# Patient Record
Sex: Female | Born: 1975 | Race: Black or African American | Hispanic: No | Marital: Single | Smoking: Never smoker
Health system: Southern US, Community
[De-identification: ages and names within clinical notes are randomized; demographics above are authoritative.]

## PROBLEM LIST (undated history)

## (undated) HISTORY — PX: DILATION AND CURETTAGE OF UTERUS: SHX78

---

## 1999-01-15 ENCOUNTER — Emergency Department (HOSPITAL_COMMUNITY): Admission: EM | Admit: 1999-01-15 | Discharge: 1999-01-15 | Payer: Self-pay | Admitting: *Deleted

## 2000-01-31 ENCOUNTER — Emergency Department (HOSPITAL_COMMUNITY): Admission: EM | Admit: 2000-01-31 | Discharge: 2000-01-31 | Payer: Self-pay | Admitting: *Deleted

## 2004-02-21 ENCOUNTER — Emergency Department (HOSPITAL_COMMUNITY): Admission: EM | Admit: 2004-02-21 | Discharge: 2004-02-21 | Payer: Self-pay | Admitting: Emergency Medicine

## 2013-07-12 ENCOUNTER — Encounter (HOSPITAL_COMMUNITY): Payer: Self-pay | Admitting: *Deleted

## 2013-07-12 ENCOUNTER — Inpatient Hospital Stay (HOSPITAL_COMMUNITY)
Admission: AD | Admit: 2013-07-12 | Discharge: 2013-07-13 | Disposition: A | Discharge status: Home / Self Care | Payer: BC Managed Care – PPO | Source: Ambulatory Visit | Attending: Obstetrics & Gynecology | Admitting: Obstetrics & Gynecology

## 2013-07-12 ENCOUNTER — Inpatient Hospital Stay (HOSPITAL_COMMUNITY): Payer: BC Managed Care – PPO

## 2013-07-12 DIAGNOSIS — N739 Female pelvic inflammatory disease, unspecified: Secondary | ICD-10-CM | POA: Insufficient documentation

## 2013-07-12 DIAGNOSIS — M549 Dorsalgia, unspecified: Secondary | ICD-10-CM | POA: Insufficient documentation

## 2013-07-12 DIAGNOSIS — N390 Urinary tract infection, site not specified: Secondary | ICD-10-CM | POA: Insufficient documentation

## 2013-07-12 DIAGNOSIS — R109 Unspecified abdominal pain: Secondary | ICD-10-CM | POA: Insufficient documentation

## 2013-07-12 DIAGNOSIS — N730 Acute parametritis and pelvic cellulitis: Secondary | ICD-10-CM

## 2013-07-12 DIAGNOSIS — Z30432 Encounter for removal of intrauterine contraceptive device: Secondary | ICD-10-CM | POA: Insufficient documentation

## 2013-07-12 DIAGNOSIS — N3000 Acute cystitis without hematuria: Secondary | ICD-10-CM

## 2013-07-12 LAB — WET PREP, GENITAL
TRICH WET PREP: NONE SEEN
Yeast Wet Prep HPF POC: NONE SEEN

## 2013-07-12 LAB — POCT PREGNANCY, URINE: Preg Test, Ur: NEGATIVE

## 2013-07-12 LAB — URINALYSIS, ROUTINE W REFLEX MICROSCOPIC
Bilirubin Urine: NEGATIVE
Glucose, UA: NEGATIVE mg/dL
Ketones, ur: NEGATIVE mg/dL
Nitrite: NEGATIVE
Protein, ur: 100 mg/dL — AB
Specific Gravity, Urine: 1.01 (ref 1.005–1.030)
Urobilinogen, UA: 2 mg/dL — ABNORMAL HIGH (ref 0.0–1.0)
pH: 8.5 — ABNORMAL HIGH (ref 5.0–8.0)

## 2013-07-12 LAB — URINE MICROSCOPIC-ADD ON

## 2013-07-12 MED ORDER — AZITHROMYCIN 250 MG PO TABS
1000.0000 mg | ORAL_TABLET | ORAL | Status: AC
  Administered 2013-07-12: 1000 mg via ORAL
  Filled 2013-07-12: qty 4

## 2013-07-12 MED ORDER — ONDANSETRON 8 MG PO TBDP
8.0000 mg | ORAL_TABLET | Freq: Once | ORAL | Status: AC
  Administered 2013-07-12: 8 mg via ORAL
  Filled 2013-07-12: qty 1

## 2013-07-12 MED ORDER — IBUPROFEN 800 MG PO TABS
800.0000 mg | ORAL_TABLET | Freq: Once | ORAL | Status: AC
  Administered 2013-07-12: 800 mg via ORAL
  Filled 2013-07-12: qty 1

## 2013-07-12 MED ORDER — CEFTRIAXONE SODIUM 250 MG IJ SOLR
250.0000 mg | INTRAMUSCULAR | Status: AC
  Administered 2013-07-12: 250 mg via INTRAMUSCULAR
  Filled 2013-07-12: qty 250

## 2013-07-12 NOTE — MAU Provider Note (Addendum)
Chief Complaint: Contraception and Abdominal Pain   First Provider Initiated Contact with Patient 07/12/13 2016     SUBJECTIVE HPI: Angelica Johnston is a 38 y.o. G2P1011 at Unknown by LMP who presents to maternity admissions reporting abdominal and back pain, fever/chills, vaginal discharge with odor, and nausea x2-3 days.  She has a Mirena IUD, placed in 2009 and has an appointment to have the device removed next week at [facility]. She has not been seen at the practice before.  She also has urinary frequency and some dysuria for the last week.  She denies vaginal bleeding, vaginal itching/burning, h/a, or dizziness.   History reviewed. No pertinent past medical history. Past Surgical History  Procedure Laterality Date  . Dilation and curettage of uterus     History   Social History  . Marital Status: Single    Spouse Name: N/A    Number of Children: N/A  . Years of Education: N/A   Occupational History  . Not on file.   Social History Main Topics  . Smoking status: Never Smoker   . Smokeless tobacco: Never Used  . Alcohol Use: No  . Drug Use: No  . Sexual Activity: Yes    Birth Control/ Protection: IUD   Other Topics Concern  . Not on file   Social History Narrative  . No narrative on file   No current facility-administered medications on file prior to encounter.   No current outpatient prescriptions on file prior to encounter.   No Known Allergies  ROS: Pertinent items in HPI  OBJECTIVE Blood pressure 121/70, pulse 111, temperature 99.9 F (37.7 C), temperature source Oral, resp. rate 18, height 5\' 3"  (1.6 m), weight 142 lb (64.411 kg), last menstrual period 07/03/2013, SpO2 100.00%. GENERAL: Well-developed, well-nourished female in mild distress.  HEENT: Normocephalic HEART: normal rate RESP: normal effort ABDOMEN: Soft, non-tender EXTREMITIES: Nontender, no edema Musculoskeletal: Negative CVA tenderness NEURO: Alert and oriented Pelvic exam:  Cervix pink, visually closed, without lesion, moderate amount thick yellow discharge with odor, Mirena string and bottom of device visible at os, vaginal walls and external genitalia normal Bimanual exam: Cervix 0/long/high, firm, anterior, neg CMT, uterus tender, nonenlarged, adnexa without tenderness, enlargement, or mass  IUD Removal  Patient was in the dorsal lithotomy position, normal external genitalia was noted.  A speculum was placed in the patient's vagina, normal discharge was noted, no lesions. The multiparous cervix was visualized, no lesions, no abnormal discharge.  The strings of the IUD were grasped and pulled using ring forceps. Some difficulty with removal of the IUD as it was encapsulated partially in the cervix so Dr Macon Large called to bedside for assistance.  The IUD was removed in its entirety by Dr Macon Large. Patient tolerated the procedure well.    Discussed contraceptive options with pt and immediate return of fertility with IUD removed.  Pt has appt with new Gyn provider next week and will start new contraception at that time.  Routine preventative health maintenance measures emphasized.    LAB RESULTS Results for orders placed during the hospital encounter of 07/12/13 (from the past 24 hour(s))  URINALYSIS, ROUTINE W REFLEX MICROSCOPIC     Status: Abnormal   Collection Time    07/12/13  7:49 PM      Result Value Ref Range   Color, Urine YELLOW  YELLOW   APPearance CLOUDY (*) CLEAR   Specific Gravity, Urine 1.010  1.005 - 1.030   pH 8.5 (*) 5.0 - 8.0  Glucose, UA NEGATIVE  NEGATIVE mg/dL   Hgb urine dipstick MODERATE (*) NEGATIVE   Bilirubin Urine NEGATIVE  NEGATIVE   Ketones, ur NEGATIVE  NEGATIVE mg/dL   Protein, ur 161100 (*) NEGATIVE mg/dL   Urobilinogen, UA 2.0 (*) 0.0 - 1.0 mg/dL   Nitrite NEGATIVE  NEGATIVE   Leukocytes, UA LARGE (*) NEGATIVE  URINE MICROSCOPIC-ADD ON     Status: None   Collection Time    07/12/13  7:49 PM      Result Value Ref Range    Squamous Epithelial / LPF RARE  RARE   WBC, UA TOO NUMEROUS TO COUNT  <3 WBC/hpf   RBC / HPF 0-2  <3 RBC/hpf   Bacteria, UA RARE  RARE  POCT PREGNANCY, URINE     Status: None   Collection Time    07/12/13  7:57 PM      Result Value Ref Range   Preg Test, Ur NEGATIVE  NEGATIVE  WET PREP, GENITAL     Status: Abnormal   Collection Time    07/12/13  8:40 PM      Result Value Ref Range   Yeast Wet Prep HPF POC NONE SEEN  NONE SEEN   Trich, Wet Prep NONE SEEN  NONE SEEN   Clue Cells Wet Prep HPF POC FEW (*) NONE SEEN   WBC, Wet Prep HPF POC MANY (*) NONE SEEN   [place] Transvaginal Non-ob  07/13/2013   CLINICAL DATA:  Abdominal pain and pelvic pain for 2-3 days  EXAM: TRANSABDOMINAL AND TRANSVAGINAL ULTRASOUND OF PELVIS  TECHNIQUE: Both transabdominal and transvaginal ultrasound examinations of the pelvis were performed. Transabdominal technique was performed for global imaging of the pelvis including uterus, ovaries, adnexal regions, and pelvic cul-de-sac. It was necessary to proceed with endovaginal exam following the transabdominal exam to visualize the endometrium, ovary and adnexal structures.  COMPARISON:  None  FINDINGS: Uterus  Measurements: 7.2 x 3.2 x 4.4 cm. No fibroids or other mass visualized.  Endometrium  Thickness: 5 mm.  No focal abnormality visualized.  Right ovary  Measurements: 2.4 x 2.0 x 1.2 cm. Normal appearance/no adnexal mass.  Left ovary  Measurements: 2.3 x 1.8 x 1.3 cm. Normal appearance/no adnexal mass.  Other:  No free fluid.  IMPRESSION: 1. No acute findings.  Normal pelvic sonogram.   Electronically Signed   By: Signa Kellaylor  Stroud M.D.   On: 07/13/2013 00:03   [place] Pelvis Complete  07/13/2013   CLINICAL DATA:  Abdominal pain and pelvic pain for 2-3 days  EXAM: TRANSABDOMINAL AND TRANSVAGINAL ULTRASOUND OF PELVIS  TECHNIQUE: Both transabdominal and transvaginal ultrasound examinations of the pelvis were performed. Transabdominal technique was performed for global imaging of the  pelvis including uterus, ovaries, adnexal regions, and pelvic cul-de-sac. It was necessary to proceed with endovaginal exam following the transabdominal exam to visualize the endometrium, ovary and adnexal structures.  COMPARISON:  None  FINDINGS: Uterus  Measurements: 7.2 x 3.2 x 4.4 cm. No fibroids or other mass visualized.  Endometrium  Thickness: 5 mm.  No focal abnormality visualized.  Right ovary  Measurements: 2.4 x 2.0 x 1.2 cm. Normal appearance/no adnexal mass.  Left ovary  Measurements: 2.3 x 1.8 x 1.3 cm. Normal appearance/no adnexal mass.  Other:  No free fluid.  IMPRESSION: 1. No acute findings.  Normal pelvic sonogram.   Electronically Signed   By: Signa Kellaylor  Stroud M.D.   On: 07/13/2013 00:03    Rocephin 250 mg IM and azithromycin 1000 mg PO in  MAU Pelvic U/S pending Report to Joseph BerkshireJulie Ethier, PA    Medication List    STOP taking these medications       levonorgestrel 20 MCG/24HR IUD  Commonly known as:  MIRENA      TAKE these medications       acetaminophen 500 MG tablet  Commonly known as:  TYLENOL  Take 1,000 mg by mouth every 6 (six) hours as needed for mild pain or headache.     azithromycin 500 MG tablet  Commonly known as:  ZITHROMAX  Take 2 tabs (1000 mg) once in 1 week     ciprofloxacin 250 MG tablet  Commonly known as:  CIPRO  Take 1 tablet (250 mg total) by mouth every 12 (twelve) hours.     ibuprofen 800 MG tablet  Commonly known as:  ADVIL,MOTRIN  Take 1 tablet (800 mg total) by mouth 3 (three) times daily.     phenazopyridine 95 MG tablet  Commonly known as:  PYRIDIUM  Take 95 mg by mouth 2 (two) times daily as needed for pain (For UTI pain.).       Follow-up Information   Follow up with [facility]. (As scheduled)       [facility] (As needed, If symptoms worsen)    Contact information:   [id number] [place] [id number] [phone]      Sharen CounterLisa  Leftwich-Kirby Certified Nurse-Midwife 07/12/2013  9:21 PM  Care assumed from Sharen CounterLisa Leftwich-Kirby, CNM Patient waiting for US.  US results are normal. No evidence of TOA Patient given 800 mg Ibuprofen and 8 mg ODT Zofran while in MAU No additional episodes of vomiting Vomiting occurred > 30 minutes after Zithromax given. Patient has been treated.  Patient is afebrile at time of discharge and no longer having significant pain or N/V  A: PID UTI IUD removal  P: Discharge home Rx for Cipro, Ibuprofen, Zofran and Zithromax given to patient Patient advised to follow-up with [facility] as scheduled to establish GYN care Patient counseled on safe sex practices and immediate return to fertility with IUD removal Patient may return to MAU as needed or if her condition were to change or worsen  Freddi StarrJulie N Ethier, PA-C 07/13/2013 12:14 AM

## 2013-07-12 NOTE — MAU Note (Signed)
IUD placed 2009; scheduled to be removed at [facility]. Lower abdominal pain & feels like IUD has shifted. Has had chills & nausea for the last few days; denies fever. Denies vaginal bleeding or discharge. Painful urination x 1 week.

## 2013-07-12 NOTE — MAU Provider Note (Signed)
Attestation of Attending Supervision of Advanced Practitioner (PA/CNM/NP): Evaluation and management procedures were performed by the Advanced Practitioner under my supervision and collaboration.  I have reviewed the Advanced Practitioner's note and chart, and I agree with the management and plan.  Michaline Kindig, MD, FACOG Attending Obstetrician & Gynecologist Faculty Practice, [facility]   

## 2013-07-13 DIAGNOSIS — N730 Acute parametritis and pelvic cellulitis: Secondary | ICD-10-CM

## 2013-07-13 MED ORDER — AZITHROMYCIN 500 MG PO TABS: ORAL_TABLET | ORAL | Status: AC

## 2013-07-13 MED ORDER — ONDANSETRON 8 MG PO TBDP: 8.0000 mg | ORAL_TABLET | Freq: Three times a day (TID) | ORAL | Status: AC | PRN

## 2013-07-13 MED ORDER — CIPROFLOXACIN HCL 250 MG PO TABS: 250.0000 mg | ORAL_TABLET | Freq: Two times a day (BID) | ORAL | Status: AC

## 2013-07-13 MED ORDER — IBUPROFEN 800 MG PO TABS: 800.0000 mg | ORAL_TABLET | Freq: Three times a day (TID) | ORAL | Status: AC

## 2013-07-13 NOTE — MAU Provider Note (Signed)
Attestation of Attending Supervision of Advanced Practitioner (PA/CNM/NP): Evaluation and management procedures were performed by the Advanced Practitioner under my supervision and collaboration.  I have reviewed the Advanced Practitioner's note and chart, and I agree with the management and plan.  Asaf Elmquist, MD, FACOG Attending Obstetrician & Gynecologist Faculty Practice, [facility]   

## 2013-07-13 NOTE — Discharge Instructions (Signed)
Pelvic Inflammatory Disease Pelvic inflammatory disease (PID) is an infection in some or all of the female organs. PID can be in the uterus, ovaries, fallopian tubes, or the surrounding tissues inside the lower belly area (pelvis). HOME CARE   If given, take your antibiotic medicine as told. Finish them even if you start to feel better.  Only take medicine as told by your doctor.  Do not have sex (intercourse) until treatment is done or as told by your doctor.  Tell your sex partner if you have PID. Your partner may need to be treated.  Keep all doctor visits. GET HELP RIGHT AWAY IF:   You have a fever.  You have more belly (abdominal) or lower belly pain.  You have chills.  You have pain when you pee (urinate).  You are not better after 72 hours.  You have more fluid (discharge) coming from your vagina or fluid that is not normal.  You need pain medicine from your doctor.  You throw up (vomit).  You cannot take your medicines.  Your partner has a sexually transmitted disease (STD). MAKE SURE YOU:   Understand these instructions.  Will watch your condition.  Will get help right away if you are not doing well or get worse. Document Released: 03/26/2008 Document Revised: 04/24/2012 Document Reviewed: 12/24/2010 [facility] Patient Information 2015 [place], [place]. This information is not intended to replace advice given to you by your health care provider. Make sure you discuss any questions you have with your health care provider. Urinary Tract Infection A urinary tract infection (UTI) can occur any place along the urinary tract. The tract includes the kidneys, ureters, bladder, and urethra. A type of germ called bacteria often causes a UTI. UTIs are often helped with antibiotic medicine.  HOME CARE   If given, take antibiotics as told by your doctor. Finish them even if you start to feel better.  Drink enough fluids to keep your pee (urine) clear or pale yellow.  Avoid  tea, drinks with caffeine, and bubbly (carbonated) drinks.  Pee often. Avoid holding your pee in for a long time.  Pee before and after having sex (intercourse).  Wipe from front to back after you poop (bowel movement) if you are a woman. Use each tissue only once. GET HELP RIGHT AWAY IF:   You have back pain.  You have lower belly (abdominal) pain.  You have chills.  You feel sick to your stomach (nauseous).  You throw up (vomit).  Your burning or discomfort with peeing does not go away.  You have a fever.  Your symptoms are not better in 3 days. MAKE SURE YOU:   Understand these instructions.  Will watch your condition.  Will get help right away if you are not doing well or get worse. Document Released: 06/16/2007 Document Revised: 09/22/2011 Document Reviewed: 07/29/2011 [facility] Patient Information 2015 [place], [place]. This information is not intended to replace advice given to you by your health care provider. Make sure you discuss any questions you have with your health care provider.

## 2013-07-14 LAB — GC/CHLAMYDIA PROBE AMP
CT PROBE, AMP APTIMA: NEGATIVE
GC PROBE AMP APTIMA: NEGATIVE

## 2013-11-12 ENCOUNTER — Encounter (HOSPITAL_COMMUNITY): Payer: Self-pay | Admitting: *Deleted

## 2015-05-01 IMAGING — US US TRANSVAGINAL NON-OB
1 series · 14 of 25 positions shown · non-contrast
Comparison: None

CLINICAL DATA: Abdominal pain and pelvic pain for 2-3 days

EXAM:
TRANSABDOMINAL AND TRANSVAGINAL ULTRASOUND OF PELVIS
TECHNIQUE: Both transabdominal and transvaginal ultrasound examinations of the
pelvis were performed. Transabdominal technique was performed for
global imaging of the pelvis including uterus, ovaries, adnexal
regions, and pelvic cul-de-sac. It was necessary to proceed with
endovaginal exam following the transabdominal exam to visualize the
endometrium, ovary and adnexal structures.

[Series 1: us pelvis complete · 14 of 70 slices shown]
[im 1/70]
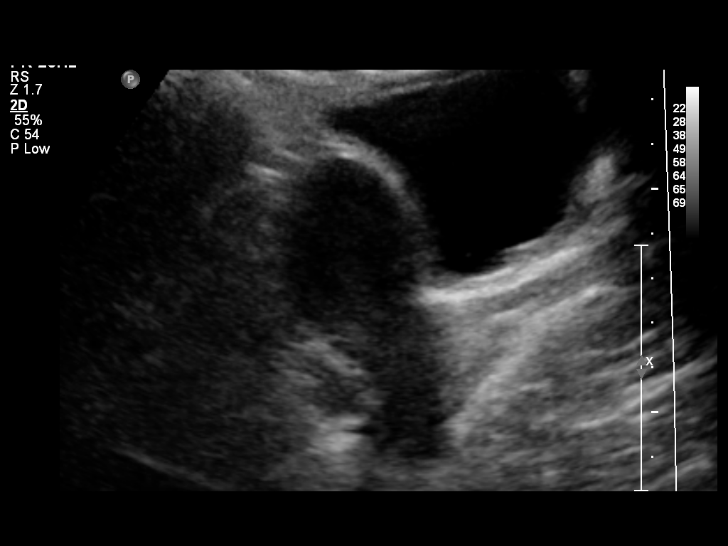
[im 6/70]
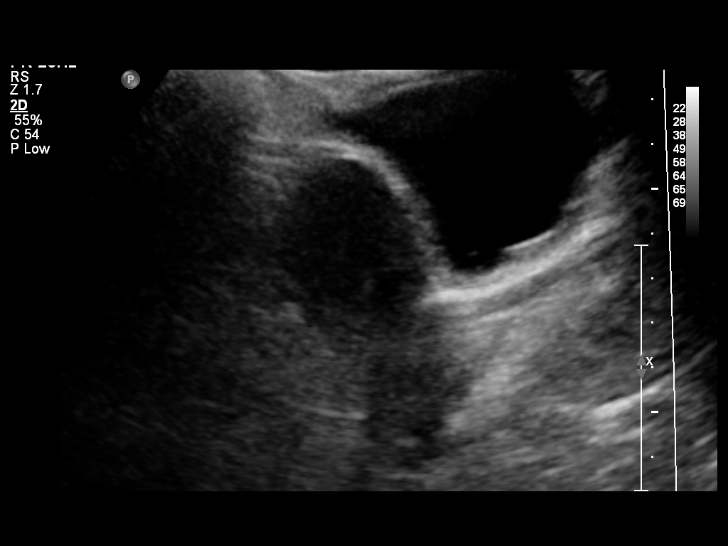
[im 12/70]
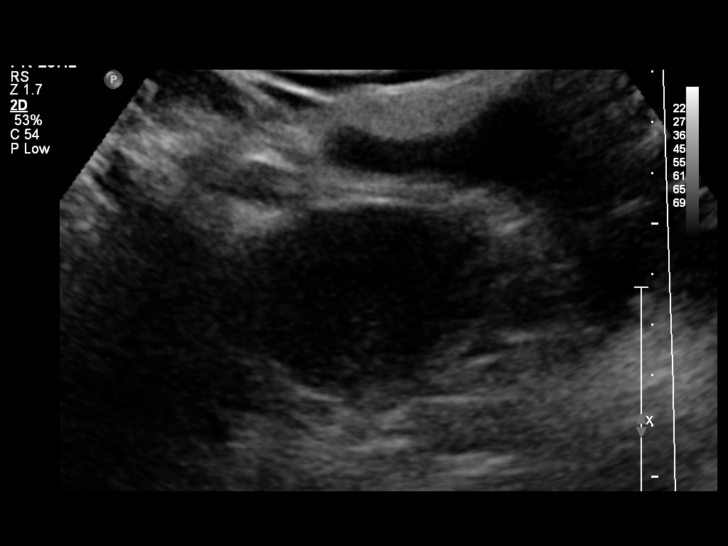
[im 18/70]
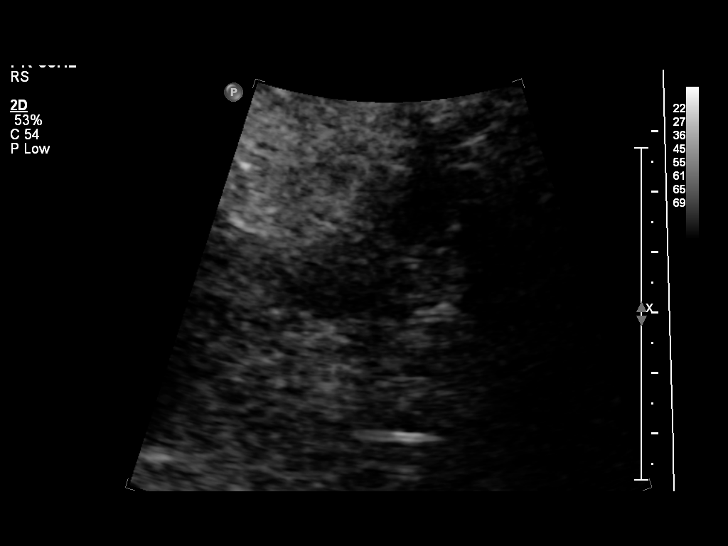
[im 24/70]
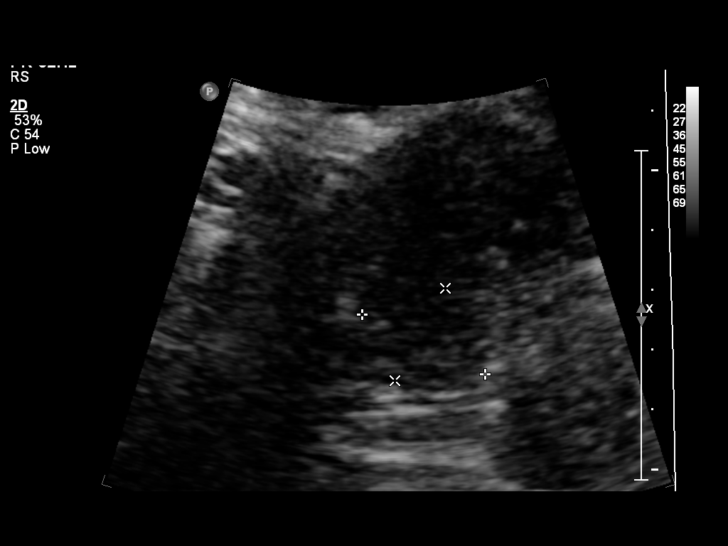
[im 26/70]
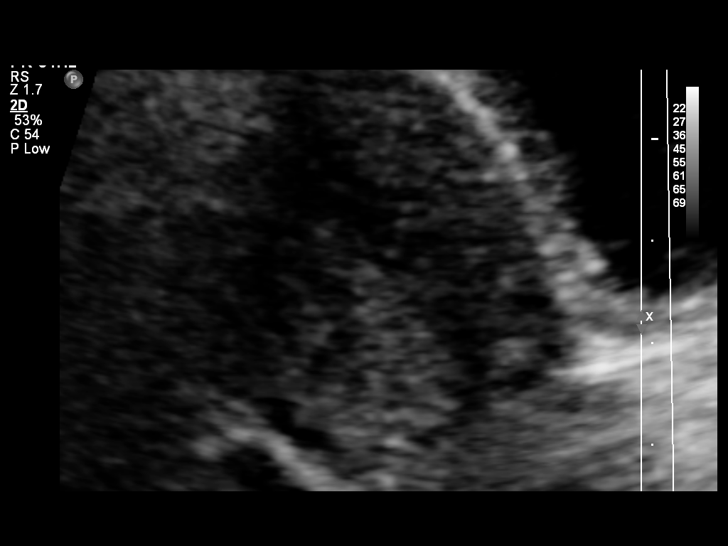
[im 32/70]
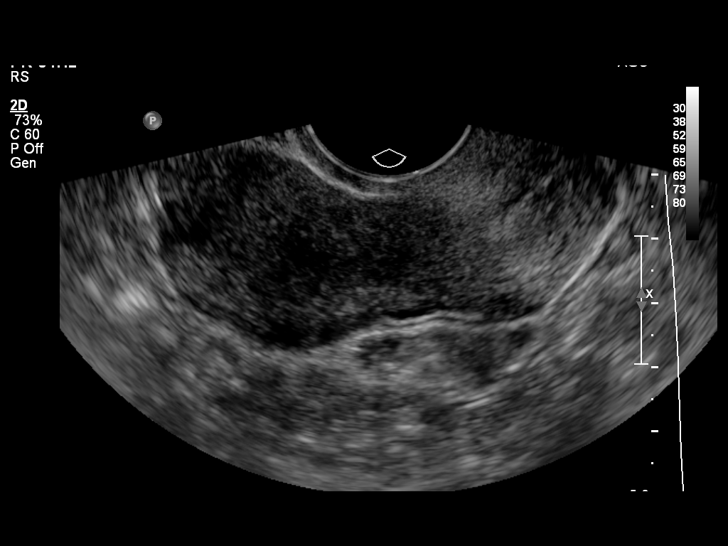
[im 38/70]
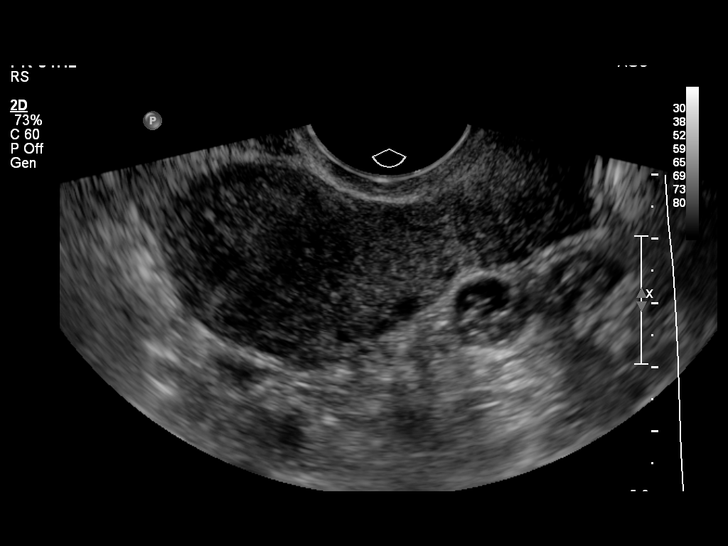
[im 44/70]
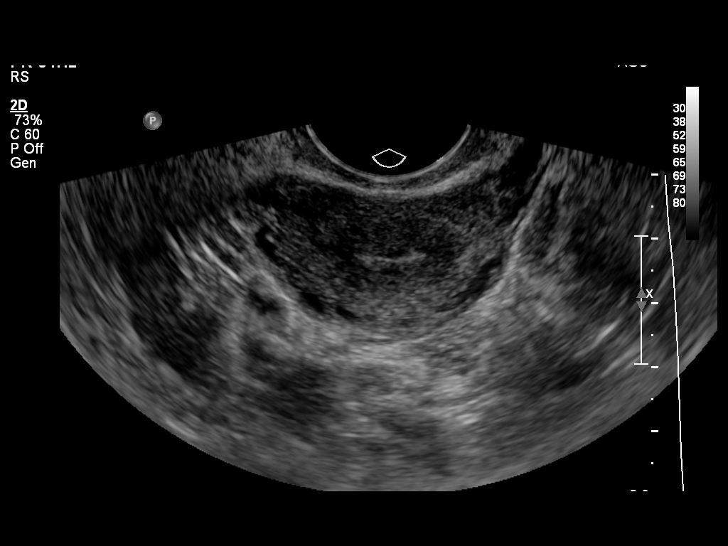
[im 47/70]
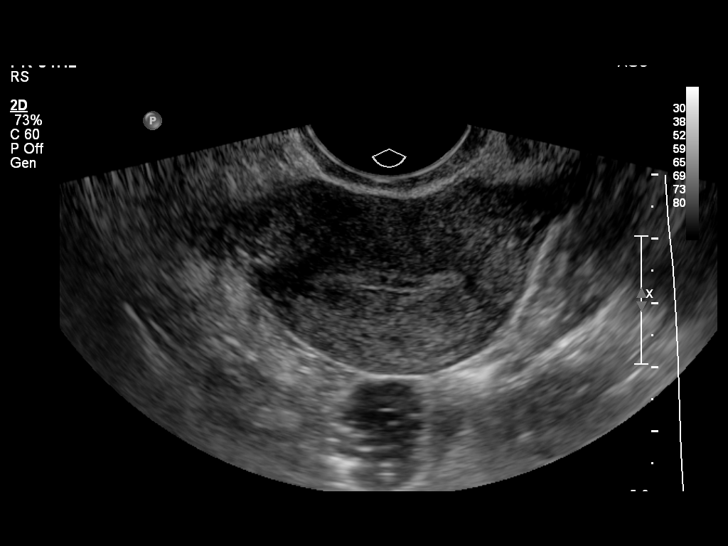
[im 52/70]
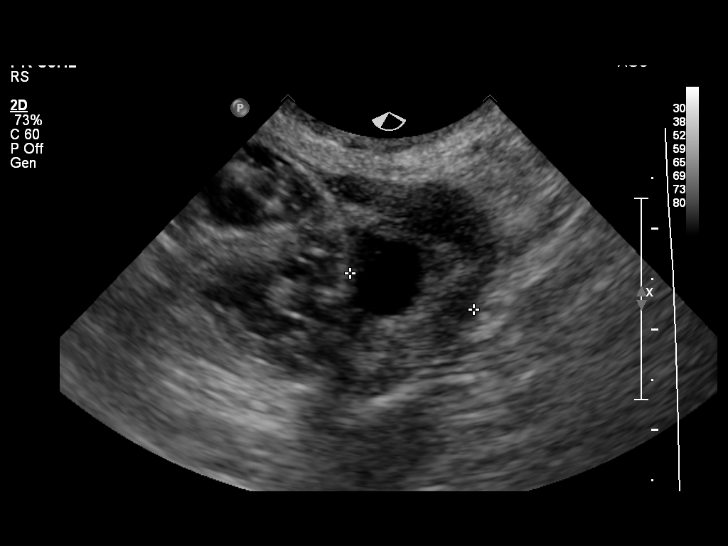
[im 58/70]
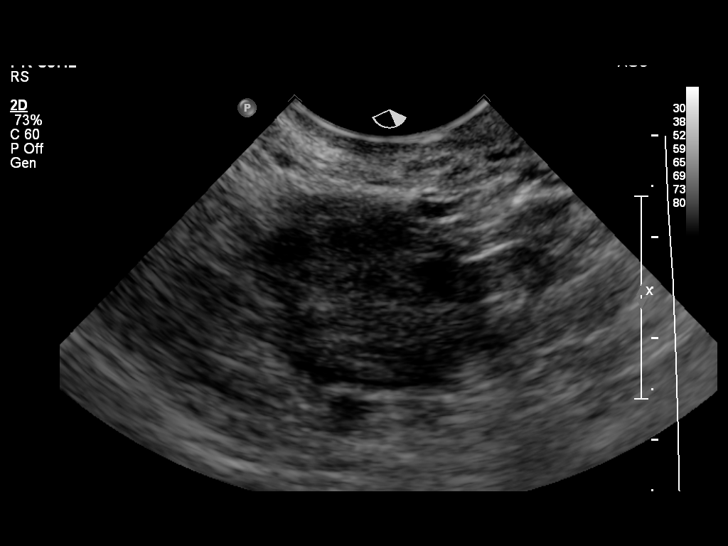
[im 64/70]
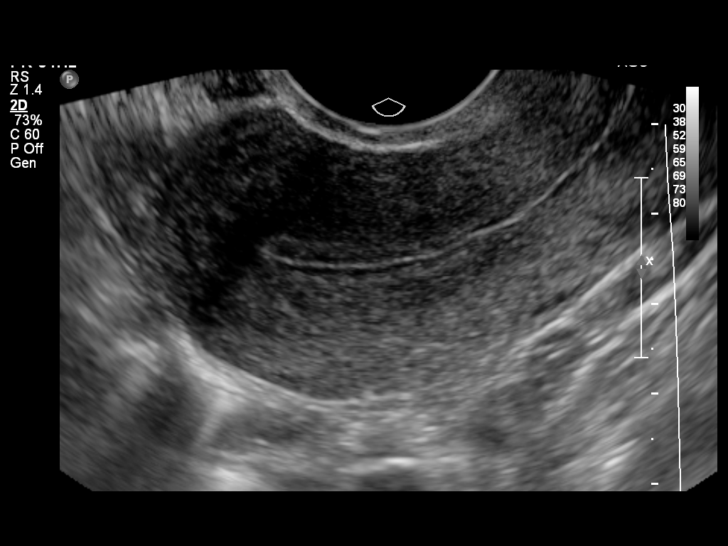
[im 70/70]
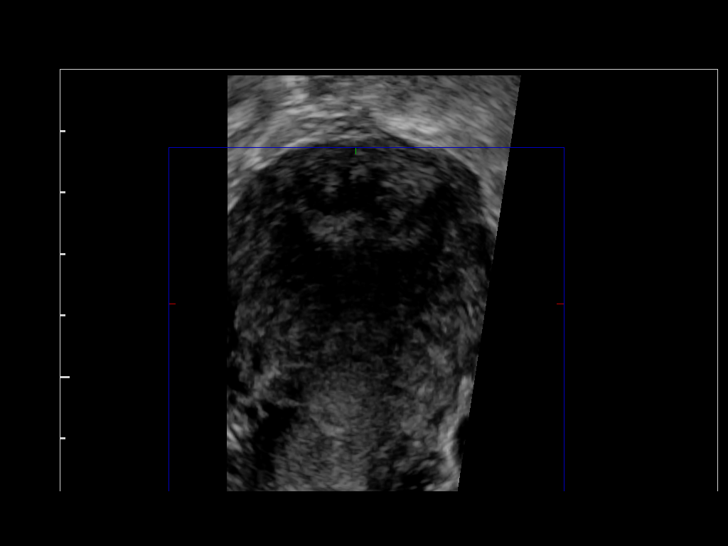

[14 of 25 positions shown; findings below may reference images not displayed]

FINDINGS: Uterus

Measurements: 7.2 x 3.2 x 4.4 cm. No fibroids or other mass
visualized.

Endometrium

Thickness: 5 mm.  No focal abnormality visualized.

Right ovary

Measurements: 2.4 x 2.0 x 1.2 cm. Normal appearance/no adnexal mass.

Left ovary

Measurements: 2.3 x 1.8 x 1.3 cm. Normal appearance/no adnexal mass.

Other:

No free fluid.
IMPRESSION: 1. No acute findings.  Normal pelvic sonogram.

## 2015-05-14 ENCOUNTER — Encounter (HOSPITAL_COMMUNITY): Payer: Self-pay | Admitting: Emergency Medicine

## 2015-05-14 ENCOUNTER — Emergency Department (HOSPITAL_COMMUNITY)
Admission: EM | Admit: 2015-05-14 | Discharge: 2015-05-14 | Disposition: A | Discharge status: Home / Self Care | Payer: Self-pay | Attending: Emergency Medicine | Admitting: Emergency Medicine

## 2015-05-14 DIAGNOSIS — Z23 Encounter for immunization: Secondary | ICD-10-CM | POA: Insufficient documentation

## 2015-05-14 DIAGNOSIS — S5012XA Contusion of left forearm, initial encounter: Secondary | ICD-10-CM | POA: Insufficient documentation

## 2015-05-14 DIAGNOSIS — Z79899 Other long term (current) drug therapy: Secondary | ICD-10-CM | POA: Insufficient documentation

## 2015-05-14 DIAGNOSIS — Z793 Long term (current) use of hormonal contraceptives: Secondary | ICD-10-CM | POA: Insufficient documentation

## 2015-05-14 DIAGNOSIS — Y939 Activity, unspecified: Secondary | ICD-10-CM | POA: Insufficient documentation

## 2015-05-14 DIAGNOSIS — S6000XA Contusion of unspecified finger without damage to nail, initial encounter: Secondary | ICD-10-CM

## 2015-05-14 DIAGNOSIS — Y999 Unspecified external cause status: Secondary | ICD-10-CM | POA: Insufficient documentation

## 2015-05-14 DIAGNOSIS — Y92410 Unspecified street and highway as the place of occurrence of the external cause: Secondary | ICD-10-CM | POA: Insufficient documentation

## 2015-05-14 DIAGNOSIS — Z792 Long term (current) use of antibiotics: Secondary | ICD-10-CM | POA: Insufficient documentation

## 2015-05-14 DIAGNOSIS — Z791 Long term (current) use of non-steroidal anti-inflammatories (NSAID): Secondary | ICD-10-CM | POA: Insufficient documentation

## 2015-05-14 MED ORDER — TETANUS-DIPHTHERIA TOXOIDS TD 5-2 LFU IM INJ
0.5000 mL | INJECTION | Freq: Once | INTRAMUSCULAR | Status: AC
  Administered 2015-05-14: 0.5 mL via INTRAMUSCULAR
  Filled 2015-05-14: qty 0.5
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
## 2015-05-14 NOTE — ED Notes (Addendum)
Pt declines x-ray and splint to left, finger or hand per EDP order. Conversation witness by this Clinical research associatewriter.

## 2015-05-14 NOTE — ED Notes (Signed)
MD at bedside. EDP ALLEN PRESENT 
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
## 2016-10-25 ENCOUNTER — Ambulatory Visit (INDEPENDENT_AMBULATORY_CARE_PROVIDER_SITE_OTHER): Payer: Self-pay | Admitting: General Practice

## 2016-10-25 DIAGNOSIS — Z3202 Encounter for pregnancy test, result negative: Secondary | ICD-10-CM

## 2016-10-25 LAB — POCT PREGNANCY, URINE: Preg Test, Ur: NEGATIVE

## 2016-10-25 NOTE — Progress Notes (Signed)
Patient here for pregnancy test today. UPT -. Patient denies taking home tests. Patient reports not receiving depo injection since February. Patient reports breast tenderness and keen sense of smell. Patient denies period since stopping depo. Discussed with patient that it can take several months for the body to establish a normal rhythm after stopping birth control. Patient desires to start new form of birth control & will go to the health department for follow up. Patient had no questions
